# Patient Record
Sex: Male | Born: 1965 | Race: Black or African American | Hispanic: No | Marital: Single | Smoking: Current every day smoker
Health system: Southern US, Community
[De-identification: ages and names within clinical notes are randomized; demographics above are authoritative.]

## PROBLEM LIST (undated history)

## (undated) DIAGNOSIS — R011 Cardiac murmur, unspecified: Secondary | ICD-10-CM

---

## 1998-03-01 ENCOUNTER — Emergency Department (HOSPITAL_COMMUNITY): Admission: EM | Admit: 1998-03-01 | Discharge: 1998-03-01 | Payer: Self-pay | Admitting: Emergency Medicine

## 2001-10-12 ENCOUNTER — Emergency Department (HOSPITAL_COMMUNITY): Admission: EM | Admit: 2001-10-12 | Discharge: 2001-10-13 | Payer: Self-pay | Admitting: Emergency Medicine

## 2001-10-13 ENCOUNTER — Encounter: Payer: Self-pay | Admitting: Emergency Medicine

## 2002-07-15 ENCOUNTER — Emergency Department (HOSPITAL_COMMUNITY): Admission: EM | Admit: 2002-07-15 | Discharge: 2002-07-16 | Payer: Self-pay | Admitting: Emergency Medicine

## 2003-11-08 ENCOUNTER — Emergency Department (HOSPITAL_COMMUNITY): Admission: EM | Admit: 2003-11-08 | Discharge: 2003-11-08 | Payer: Self-pay

## 2009-04-06 ENCOUNTER — Emergency Department (HOSPITAL_COMMUNITY): Admission: EM | Admit: 2009-04-06 | Discharge: 2009-04-06 | Payer: Self-pay | Admitting: Emergency Medicine

## 2012-12-18 ENCOUNTER — Encounter (HOSPITAL_COMMUNITY): Payer: Self-pay | Admitting: Adult Health

## 2012-12-18 ENCOUNTER — Emergency Department (HOSPITAL_COMMUNITY)
Admission: EM | Admit: 2012-12-18 | Discharge: 2012-12-18 | Disposition: A | Discharge status: Home / Self Care | Payer: Self-pay | Attending: Emergency Medicine | Admitting: Emergency Medicine

## 2012-12-18 DIAGNOSIS — F17200 Nicotine dependence, unspecified, uncomplicated: Secondary | ICD-10-CM | POA: Insufficient documentation

## 2012-12-18 DIAGNOSIS — R011 Cardiac murmur, unspecified: Secondary | ICD-10-CM | POA: Insufficient documentation

## 2012-12-18 DIAGNOSIS — H7290 Unspecified perforation of tympanic membrane, unspecified ear: Secondary | ICD-10-CM | POA: Insufficient documentation

## 2012-12-18 DIAGNOSIS — H7293 Unspecified perforation of tympanic membrane, bilateral: Secondary | ICD-10-CM

## 2012-12-18 HISTORY — DX: Cardiac murmur, unspecified: R01.1

## 2012-12-18 MED ORDER — OFLOXACIN 0.3 % OP SOLN
5.0000 [drp] | Freq: Every day | OPHTHALMIC | Status: DC
  Administered 2012-12-18: 5 [drp] via OTIC
  Filled 2012-12-18: qty 5

## 2012-12-18 MED ORDER — OFLOXACIN 0.3 % OT SOLN
5.0000 [drp] | Freq: Every day | OTIC | Status: DC
  Filled 2012-12-18: qty 5

## 2012-12-18 NOTE — ED Provider Notes (Signed)
History     CSN: [id number]  Arrival date & time 12/18/12  0308   First MD Initiated Contact with Patient 12/18/12 [phone]      Chief Complaint  Patient presents with  . Ear Fullness    (Consider location/radiation/quality/duration/timing/severity/associated sxs/prior treatment) HPI Patient presents with concern of full sensation in both ears and hearing loss in the right greater than the left ear.  Symptoms began approximately one month ago.  Initially there was fullness and hearing loss in the right ear.  This has progressed over the interval, and the patient now complains of left ear fullness and hearing loss as well. THERE IS MILD PAIN AROUND THE POSTERIOR OF YEARS< BUT NO NEW HEADACHES< VISUAL CHANGES< FEVER< CHILLS< NAUSEA< VOMITING> THE PATIENT STATES THAT HE HAS BEEN CLEANING HIS YEARS WITH A"SCREW".   Past Medical History  Diagnosis Date  . Heart murmur     History reviewed. No pertinent past surgical history.  History reviewed. No pertinent family history.  History  Substance Use Topics  . Smoking status: Current Every Day Smoker -- 1.00 packs/day    Types: Cigarettes  . Smokeless tobacco: Not on file  . Alcohol Use: Yes      Review of Systems  All other systems reviewed and are negative.    Allergies  Review of patient's allergies indicates no known allergies.  Home Medications  No current outpatient prescriptions on file.  BP 137/87  Pulse 77  Temp(Src) 98.1 F (36.7 C) (Oral)  Resp 18  SpO2 97%  Physical Exam  Nursing note and vitals reviewed. Constitutional: He is oriented to person, place, and time. He appears well-developed and well-nourished. No distress.  HENT:  Head: Normocephalic and atraumatic.  Disruption of both tympanic membranes with visible in either ear. There is minimal surrounding erythema, no discharge in either ear  Eyes: Conjunctivae are normal. Pupils are equal, round, and reactive to light. Right eye exhibits no  discharge. Left eye exhibits no discharge.  Neck: Neck supple.  Cardiovascular: Normal rate and regular rhythm.   No murmur heard. Pulmonary/Chest: Effort normal. No stridor. No respiratory distress.  Neurological: He is alert and oriented to person, place, and time.  Poor hearing in both ears, otherwise unremarkable cranial nerve exam  Skin: Skin is warm and dry. He is not diaphoretic.  Psychiatric: He has a normal mood and affect.    ED Course  Procedures (including critical care time)  Labs Reviewed - No data to display No results found.   1. Perforation of both tympanic membranes       MDM  This patient presents with greater than one month of the year concerns, and on exam has evidence of perforation of both tympanic membranes.  Patient is afebrile, otherwise in no distress.  Given the prolonged time course of his injuries, he was started on antibiotics for both ears, discharged with ENT followup.        Gerhard Munch, MD 12/18/12 [phone]

## 2012-12-18 NOTE — ED Notes (Signed)
C/o ear pain, states its been going on for approx. 1 month. States its worse when he lays down.

## 2012-12-18 NOTE — ED Notes (Signed)
Pain to right started about one month and half again and now pain going to left ear. just coming from a party and the ear pain is getting worse, stated ear is feel full. Stated drinking 1/2 paint of liquor and six pack of beer tonight.

## 2018-04-13 ENCOUNTER — Other Ambulatory Visit: Payer: Self-pay

## 2018-04-13 ENCOUNTER — Encounter (HOSPITAL_COMMUNITY): Payer: Self-pay | Admitting: *Deleted

## 2018-04-13 ENCOUNTER — Emergency Department (HOSPITAL_COMMUNITY)
Admission: EM | Admit: 2018-04-13 | Discharge: 2018-04-14 | Disposition: A | Discharge status: Home / Self Care | Payer: Self-pay | Attending: Emergency Medicine | Admitting: Emergency Medicine

## 2018-04-13 DIAGNOSIS — M79604 Pain in right leg: Secondary | ICD-10-CM

## 2018-04-13 DIAGNOSIS — L03115 Cellulitis of right lower limb: Secondary | ICD-10-CM

## 2018-04-13 DIAGNOSIS — F17210 Nicotine dependence, cigarettes, uncomplicated: Secondary | ICD-10-CM | POA: Insufficient documentation

## 2018-04-13 NOTE — ED Triage Notes (Signed)
The pt is c/o rt lower leg pain for 2 days he initially injured his rt lower leg by a scrape he would not go into details  reness pain and swelling to the rt leg

## 2018-04-14 ENCOUNTER — Emergency Department (HOSPITAL_COMMUNITY): Payer: Self-pay

## 2018-04-14 LAB — CBC
HCT: 37.8 % — ABNORMAL LOW (ref 39.0–52.0)
HEMOGLOBIN: 12.3 g/dL — AB (ref 13.0–17.0)
MCH: 30.6 pg (ref 26.0–34.0)
MCHC: 32.5 g/dL (ref 30.0–36.0)
MCV: 94 fL (ref 80.0–100.0)
Platelets: 123 10*3/uL — ABNORMAL LOW (ref 150–400)
RBC: 4.02 MIL/uL — ABNORMAL LOW (ref 4.22–5.81)
RDW: 15.1 % (ref 11.5–15.5)
WBC: 6.2 10*3/uL (ref 4.0–10.5)
nRBC: 0 % (ref 0.0–0.2)

## 2018-04-14 LAB — BASIC METABOLIC PANEL
ANION GAP: 12 (ref 5–15)
BUN: 7 mg/dL (ref 6–20)
CO2: 27 mmol/L (ref 22–32)
CREATININE: 0.95 mg/dL (ref 0.61–1.24)
Calcium: 9.4 mg/dL (ref 8.9–10.3)
Chloride: 103 mmol/L (ref 98–111)
GFR calc non Af Amer: >60 mL/min (ref >60)
Glucose, Bld: 89 mg/dL (ref 70–99)
Potassium: 3.7 mmol/L (ref 3.5–5.1)
Sodium: 142 mmol/L (ref 135–145)

## 2018-04-14 MED ORDER — CEPHALEXIN 250 MG PO CAPS
500.0000 mg | ORAL_CAPSULE | Freq: Once | ORAL | Status: AC
Start: 2018-04-14 — End: 2018-04-14
  Administered 2018-04-14: 500 mg via ORAL
  Filled 2018-04-14: qty 2

## 2018-04-14 MED ORDER — CEPHALEXIN 500 MG PO CAPS: 500.0000 mg | ORAL_CAPSULE | Freq: Three times a day (TID) | ORAL | 0 refills | Status: AC

## 2018-04-14 NOTE — ED Notes (Signed)
Pt reports swelling, redness, and warmth to RLE for "weeks" pt reports that he recently "rubbed it against a wood pallet at work" and now the pain has been worse. Pt has abrassion to right leg that has a scab at this time.

## 2018-04-14 NOTE — ED Notes (Signed)
Patient transported to X-ray 

## 2018-04-14 NOTE — ED Provider Notes (Signed)
[facility] EMERGENCY DEPARTMENT Provider Note  CSN: [id number] Arrival date & time: 04/13/18 2318  Chief Complaint(s) Leg Pain  HPI Steven Arellano is a 52 y.o. male who presents to the emergency department with right leg pain that began 2 days ago.  Patient reports that he had his shin on a wood pallet while at work.  Since then he is noted increased pain and swelling.  He does endorse bilateral lower extremity swelling which is been there for several weeks which she attributes to being on his feet all day.  However the right side swelling has worsened since the injury.  He endorses associated pain with palpation and mild redness.  Pain is exacerbated with palpation.  Alleviated by mobility.  Denies any other injuries.  Denies any associated chest pain or shortness of breath.  No prior history of blood clots.  He does endorse illicit drug use including crack and cocaine.  Reports daily alcohol and tobacco use.  HPI  Past Medical History Past Medical History:  Diagnosis Date  . Heart murmur    There are no active problems to display for this patient.  Home Medication(s) Prior to Admission medications   Medication Sig Start Date End Date Taking? Authorizing Provider  cephALEXin (KEFLEX) 500 MG capsule Take 1 capsule (500 mg total) by mouth 3 (three) times daily for 7 days. 04/14/18 04/21/18  Nira Conn, MD                                                                                                                                    Past Surgical History History reviewed. No pertinent surgical history. Family History No family history on file.  Social History Social History   Tobacco Use  . Smoking status: Current Every Day Smoker    Packs/day: 1.00    Types: Cigarettes  Substance Use Topics  . Alcohol use: Yes  . Drug use: Yes    Types: Cocaine   Allergies Patient has no known allergies.  Review of Systems Review of Systems All other  systems are reviewed and are negative for acute change except as noted in the HPI  Physical Exam Vital Signs  I have reviewed the triage vital signs BP (!) 137/98 (BP Location: Right Arm)   Pulse 67   Temp 98.9 F (37.2 C) (Oral)   Resp 16   Ht 5\' 11"  (1.803 m)   Wt 81.6 kg   SpO2 99%   BMI 25.10 kg/m   Physical Exam  Constitutional: He is oriented to person, place, and time. He appears well-developed and well-nourished. No distress.  HENT:  Head: Normocephalic and atraumatic.  Right Ear: External ear normal.  Left Ear: External ear normal.  Nose: Nose normal.  Mouth/Throat: Mucous membranes are normal. No trismus in the jaw.  Eyes: Pupils are equal, round, and reactive to light. Conjunctivae and EOM are normal. Right eye exhibits no  discharge. Left eye exhibits no discharge. No scleral icterus.  Neck: Normal range of motion and phonation normal. Neck supple.  Cardiovascular: Normal rate and regular rhythm. Exam reveals no gallop and no friction rub.  No murmur heard. Pulmonary/Chest: Effort normal and breath sounds normal. No stridor. No respiratory distress. He has no rales.  Abdominal: Soft. He exhibits no distension. There is no tenderness.  Musculoskeletal: Normal range of motion.       Right lower leg: He exhibits tenderness, swelling and edema.       Legs: Neurological: He is alert and oriented to person, place, and time.  Skin: Skin is warm and dry. No rash noted. He is not diaphoretic. No erythema.  Psychiatric: He has a normal mood and affect. His behavior is normal.  Vitals reviewed.   ED Results and Treatments Labs (all labs ordered are listed, but only abnormal results are displayed) Labs Reviewed  CBC - Abnormal; Notable for the following components:      Result Value   RBC 4.02 (*)    Hemoglobin 12.3 (*)    HCT 37.8 (*)    Platelets 123 (*)    All other components within normal limits  BASIC METABOLIC PANEL                                                                                                                          EKG  EKG Interpretation  Date/Time:    Ventricular Rate:    PR Interval:    QRS Duration:   QT Interval:    QTC Calculation:   R Axis:     Text Interpretation:        Radiology Dg Tibia/fibula Right  Result Date: 04/14/2018 CLINICAL DATA:  52 y/o  M; right tibia fibula injury 2 days ago. EXAM: RIGHT TIBIA AND FIBULA - 2 VIEW COMPARISON:  None. FINDINGS: There is no evidence of fracture or other focal bone lesions. Soft tissues are unremarkable. IMPRESSION: Negative. Electronically Signed   By: Mitzi Hansen M.D.   On: 04/14/2018 02:37   Pertinent labs & imaging results that were available during my care of the patient were reviewed by me and considered in my medical decision making (see chart for details).  Medications Ordered in ED Medications  cephALEXin (KEFLEX) capsule 500 mg (500 mg Oral Given 04/14/18 0302)  Procedures Procedures  (including critical care time)  Medical Decision Making / ED Course I have reviewed the nursing notes for this encounter and the patient's prior records (if available in EHR or on provided paperwork).    Right leg pain following mild trauma.  There is associated hyperemia and tenderness to palpation in the area.  Plain film was negative for any acute injuries or subcutaneous air.  Labs grossly reassuring without leukocytosis.  Possible soft tissue contusion however given the mild hyperemia, will treat for possible cellulitis.  Doubt DVT.   Final Clinical Impression(s) / ED Diagnoses Final diagnoses:  Right leg pain  Cellulitis of right lower extremity    Disposition: Discharge  Condition: Good  I have discussed the results, Dx and Tx plan with the patient who expressed understanding and agree(s) with the plan. Discharge  instructions discussed at great length. The patient was given strict return precautions who verbalized understanding of the instructions. No further questions at time of discharge.    ED Discharge Orders         Ordered    cephALEXin (KEFLEX) 500 MG capsule  3 times daily     04/14/18 0326           Follow Up: Primary care provider   If you do not have a primary care physician, contact [facility] at [phone] for referral     This chart was dictated using voice recognition software.  Despite best efforts to proofread,  errors can occur which can change the documentation meaning.   Nira Conn, MD 04/14/18 [phone]

## 2018-04-14 NOTE — ED Notes (Signed)
ED Provider at bedside. 

## 2018-09-14 ENCOUNTER — Other Ambulatory Visit: Payer: Self-pay

## 2018-09-14 ENCOUNTER — Emergency Department (HOSPITAL_COMMUNITY)
Admission: EM | Admit: 2018-09-14 | Discharge: 2018-09-14 | Disposition: A | Discharge status: Home / Self Care | Payer: Self-pay | Attending: Emergency Medicine | Admitting: Emergency Medicine

## 2018-09-14 ENCOUNTER — Emergency Department (HOSPITAL_COMMUNITY): Payer: Self-pay

## 2018-09-14 ENCOUNTER — Encounter (HOSPITAL_COMMUNITY): Payer: Self-pay | Admitting: *Deleted

## 2018-09-14 DIAGNOSIS — Y9389 Activity, other specified: Secondary | ICD-10-CM | POA: Insufficient documentation

## 2018-09-14 DIAGNOSIS — F17210 Nicotine dependence, cigarettes, uncomplicated: Secondary | ICD-10-CM | POA: Insufficient documentation

## 2018-09-14 DIAGNOSIS — S0102XA Laceration with foreign body of scalp, initial encounter: Secondary | ICD-10-CM | POA: Insufficient documentation

## 2018-09-14 DIAGNOSIS — R030 Elevated blood-pressure reading, without diagnosis of hypertension: Secondary | ICD-10-CM

## 2018-09-14 DIAGNOSIS — S0181XA Laceration without foreign body of other part of head, initial encounter: Secondary | ICD-10-CM

## 2018-09-14 DIAGNOSIS — S0182XA Laceration with foreign body of other part of head, initial encounter: Secondary | ICD-10-CM | POA: Insufficient documentation

## 2018-09-14 DIAGNOSIS — S0101XA Laceration without foreign body of scalp, initial encounter: Secondary | ICD-10-CM

## 2018-09-14 DIAGNOSIS — Y999 Unspecified external cause status: Secondary | ICD-10-CM | POA: Insufficient documentation

## 2018-09-14 DIAGNOSIS — Y92410 Unspecified street and highway as the place of occurrence of the external cause: Secondary | ICD-10-CM | POA: Insufficient documentation

## 2018-09-14 MED ORDER — LIDOCAINE-EPINEPHRINE-TETRACAINE (LET) SOLUTION
3.0000 mL | Freq: Once | NASAL | Status: AC
  Administered 2018-09-14: 3 mL via TOPICAL
  Filled 2018-09-14: qty 3

## 2018-09-14 MED ORDER — LIDOCAINE-EPINEPHRINE (PF) 2 %-1:200000 IJ SOLN: 10.0000 mL | Freq: Once | INTRAMUSCULAR | Status: DC

## 2018-09-14 NOTE — ED Notes (Signed)
Patient verbalizes understanding of discharge instructions . Opportunity for questions and answers were provided . Armband removed by staff ,Pt discharged from ED. W/C  offered at D/C  and Declined W/C at D/C and was escorted to lobby by RN.  
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
## 2018-09-20 ENCOUNTER — Other Ambulatory Visit: Payer: Self-pay

## 2018-09-20 ENCOUNTER — Encounter (HOSPITAL_COMMUNITY): Payer: Self-pay | Admitting: Emergency Medicine

## 2018-09-20 ENCOUNTER — Emergency Department (HOSPITAL_COMMUNITY)
Admission: EM | Admit: 2018-09-20 | Discharge: 2018-09-20 | Disposition: A | Discharge status: Home / Self Care | Payer: No Typology Code available for payment source | Attending: Emergency Medicine | Admitting: Emergency Medicine

## 2018-09-20 ENCOUNTER — Emergency Department (HOSPITAL_COMMUNITY): Payer: No Typology Code available for payment source

## 2018-09-20 DIAGNOSIS — Y929 Unspecified place or not applicable: Secondary | ICD-10-CM | POA: Insufficient documentation

## 2018-09-20 DIAGNOSIS — F17210 Nicotine dependence, cigarettes, uncomplicated: Secondary | ICD-10-CM | POA: Diagnosis not present

## 2018-09-20 DIAGNOSIS — S161XXA Strain of muscle, fascia and tendon at neck level, initial encounter: Secondary | ICD-10-CM | POA: Diagnosis present

## 2018-09-20 DIAGNOSIS — Y999 Unspecified external cause status: Secondary | ICD-10-CM | POA: Diagnosis not present

## 2018-09-20 DIAGNOSIS — Y9389 Activity, other specified: Secondary | ICD-10-CM | POA: Diagnosis not present

## 2018-09-20 NOTE — ED Notes (Signed)
Patient verbalizes understanding of discharge instructions . Opportunity for questions and answers were provided . Armband removed by staff ,Pt discharged from ED. W/C  offered at D/C  and Declined W/C at D/C and was escorted to lobby by RN.  
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
## 2018-09-20 NOTE — Discharge Instructions (Signed)
Please read attached information. If you experience any new or worsening signs or symptoms please return to the emergency room for evaluation. Please follow-up with your primary care provider or specialist as discussed.  °
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
## 2020-04-27 ENCOUNTER — Encounter (HOSPITAL_COMMUNITY): Payer: Self-pay

## 2020-04-27 ENCOUNTER — Other Ambulatory Visit: Payer: Self-pay

## 2020-04-27 ENCOUNTER — Emergency Department (HOSPITAL_COMMUNITY)
Admission: EM | Admit: 2020-04-27 | Discharge: 2020-04-27 | Disposition: A | Discharge status: Home / Self Care | Payer: Self-pay | Attending: Emergency Medicine | Admitting: Emergency Medicine

## 2020-04-27 DIAGNOSIS — H5789 Other specified disorders of eye and adnexa: Secondary | ICD-10-CM | POA: Insufficient documentation

## 2020-04-27 DIAGNOSIS — J069 Acute upper respiratory infection, unspecified: Secondary | ICD-10-CM | POA: Insufficient documentation

## 2020-04-27 DIAGNOSIS — Z20822 Contact with and (suspected) exposure to covid-19: Secondary | ICD-10-CM | POA: Insufficient documentation

## 2020-04-27 DIAGNOSIS — F17210 Nicotine dependence, cigarettes, uncomplicated: Secondary | ICD-10-CM | POA: Insufficient documentation

## 2020-04-27 LAB — RESPIRATORY PANEL BY RT PCR (FLU A&B, COVID)
Influenza A by PCR: NEGATIVE
Influenza B by PCR: NEGATIVE
SARS Coronavirus 2 by RT PCR: NEGATIVE

## 2020-04-27 MED ORDER — ACETAMINOPHEN 325 MG PO TABS
650.0000 mg | ORAL_TABLET | Freq: Once | ORAL | Status: AC
  Administered 2020-04-27: 650 mg via ORAL
  Filled 2020-04-27: qty 2

## 2020-04-27 MED ORDER — IBUPROFEN 400 MG PO TABS
600.0000 mg | ORAL_TABLET | Freq: Once | ORAL | Status: AC
  Administered 2020-04-27: 600 mg via ORAL
  Filled 2020-04-27: qty 1

## 2020-04-27 NOTE — ED Provider Notes (Signed)
[facility] EMERGENCY DEPARTMENT Provider Note   CSN: [id number] Arrival date & time: 04/27/20  1546     History Chief Complaint  Patient presents with   Burning Eyes   Generalized Body Aches   Nasal Congestion    RYDEN WAINER is a 54 y.o. male.  The history is provided by the patient.  URI Presenting symptoms: congestion, cough, fatigue, rhinorrhea and sore throat   Presenting symptoms: no ear pain and no fever   Severity:  Moderate Onset quality:  Sudden Duration:  1 day Timing:  Constant Progression:  Worsening Chronicity:  New Relieved by:  Nothing Worsened by:  Nothing Associated symptoms: myalgias and sneezing   Associated symptoms: no arthralgias   Risk factors: sick contacts (Reports his brother's kids are sick with similar symptoms)        Past Medical History:  Diagnosis Date   Heart murmur     There are no problems to display for this patient.   History reviewed. No pertinent surgical history.     History reviewed. No pertinent family history.  Social History   Tobacco Use   Smoking status: Current Every Day Smoker    Packs/day: 1.00    Types: Cigarettes   Smokeless tobacco: Never Used  Vaping Use   Vaping Use: Never used  Substance Use Topics   Alcohol use: Yes   Drug use: Yes    Types: Cocaine    Home Medications Prior to Admission medications   Not on File    Allergies    Patient has no known allergies.  Review of Systems   Review of Systems  Constitutional: Positive for fatigue. Negative for chills and fever.  HENT: Positive for congestion, rhinorrhea, sneezing and sore throat. Negative for ear pain.   Eyes: Positive for pain, discharge and redness. Negative for visual disturbance.  Respiratory: Positive for cough. Negative for shortness of breath.   Cardiovascular: Negative for chest pain and palpitations.  Gastrointestinal: Negative for abdominal pain and vomiting.  Genitourinary:  Negative for dysuria and hematuria.  Musculoskeletal: Positive for myalgias. Negative for arthralgias and back pain.  Skin: Negative for color change and rash.  Neurological: Negative for seizures and syncope.  All other systems reviewed and are negative.   Physical Exam Updated Vital Signs BP (!) 142/71    Pulse 79    Temp 98.1 F (36.7 C) (Oral)    Resp 18    Ht 5\' 11"  (1.803 m)    Wt 59.9 kg    SpO2 100%    BMI 18.41 kg/m   Physical Exam Vitals and nursing note reviewed.  Constitutional:      Appearance: He is well-developed. He is not ill-appearing, toxic-appearing or diaphoretic.     Comments: Appears uncomfortable  HENT:     Head: Normocephalic and atraumatic.     Right Ear: External ear normal.     Left Ear: External ear normal.     Nose: Rhinorrhea present.     Mouth/Throat:     Mouth: Mucous membranes are moist.     Pharynx: Oropharynx is clear. No oropharyngeal exudate or posterior oropharyngeal erythema.  Eyes:     General:        Right eye: Discharge (copious watery) present.        Left eye: Discharge (copious watery) present.    Conjunctiva/sclera:     Right eye: Right conjunctiva is injected.     Left eye: Left conjunctiva is injected.  Pupils: Pupils are equal, round, and reactive to light.     Visual Fields: Right eye visual fields normal and left eye visual fields normal.  Cardiovascular:     Rate and Rhythm: Normal rate and regular rhythm.     Heart sounds: No murmur heard.  No gallop.   Pulmonary:     Effort: Pulmonary effort is normal. No respiratory distress.     Breath sounds: Normal breath sounds.  Abdominal:     Palpations: Abdomen is soft.     Tenderness: There is no abdominal tenderness.  Musculoskeletal:     Cervical back: Neck supple.  Lymphadenopathy:     Cervical: No cervical adenopathy.  Skin:    General: Skin is warm and dry.  Neurological:     Mental Status: He is alert.     ED Results / Procedures / Treatments   Labs (all  labs ordered are listed, but only abnormal results are displayed) Labs Reviewed  RESPIRATORY PANEL BY RT PCR (FLU A&B, COVID)    EKG None  Radiology No results found.  Procedures Procedures (including critical care time)  Medications Ordered in ED Medications - No data to display  ED Course  I have reviewed the triage vital signs and the nursing notes.  Pertinent labs & imaging results that were available during my care of the patient were reviewed by me and considered in my medical decision making (see chart for details).    MDM Rules/Calculators/A&P                          The patient is a 54yo male, PMH otherwise healthy who presents to the ED for URI symptoms.  On my initial evaluation, the patient is hemodynamically stable, afebrile, nontoxic-appearing. Physical exam remarkable for inflamed conjunctive a but no purulent discharge, some rhinorrhea, no visual deficits.  Likely viral illness with watery discharge and multiple symptoms.  Covid test ordered and pending.  Tylenol and Motrin provided for pain and discomfort.  Very low suspicion for pneumonia or bacterial infection, do not think patient requires chest x-ray, labs, or antibiotics or other interventions at this time.  Advised patient of concern for viral illness. Advised treatment of symptoms with Tylenol Motrin every 6 hours for pain, Benadryl or other antihistamine for conjunctival symptoms.  Advised isolation precautions until Covid test back and negative, or for 10 days if positive.  Recommended follow-up with PCP in the next couple days. Strict return precautions provided. Patient discharged in stable condition.   The care of this patient was overseen by Dr. Judd Lien, who agreed with evaluation and plan of care.   Final Clinical Impression(s) / ED Diagnoses Final diagnoses:  Upper respiratory tract infection, unspecified type    Rx / DC Orders ED Discharge Orders    None       Loletha Carrow,  MD 04/27/20 Windy Carina    Geoffery Lyons, MD 04/27/20 2225

## 2020-04-27 NOTE — ED Triage Notes (Signed)
Pt presents to ED onset yesterday sore throat, nasal congestion, body aches cough, bilateral eye burning and sclera redness.  Pt states he normally everyday has eye drainage and watery eyes.

## 2021-03-11 ENCOUNTER — Emergency Department (HOSPITAL_COMMUNITY): Payer: Self-pay

## 2021-03-11 ENCOUNTER — Other Ambulatory Visit: Payer: Self-pay

## 2021-03-11 ENCOUNTER — Emergency Department (HOSPITAL_COMMUNITY)
Admission: EM | Admit: 2021-03-11 | Discharge: 2021-03-11 | Disposition: A | Discharge status: Home / Self Care | Payer: Self-pay | Attending: Emergency Medicine | Admitting: Emergency Medicine

## 2021-03-11 DIAGNOSIS — F17210 Nicotine dependence, cigarettes, uncomplicated: Secondary | ICD-10-CM | POA: Insufficient documentation

## 2021-03-11 DIAGNOSIS — M545 Low back pain, unspecified: Secondary | ICD-10-CM | POA: Insufficient documentation

## 2021-03-11 DIAGNOSIS — R109 Unspecified abdominal pain: Secondary | ICD-10-CM | POA: Insufficient documentation

## 2021-03-11 LAB — CBC WITH DIFFERENTIAL/PLATELET
Abs Immature Granulocytes: 0.01 10*3/uL (ref 0.00–0.07)
Basophils Absolute: 0 10*3/uL (ref 0.0–0.1)
Basophils Relative: 1 %
Eosinophils Absolute: 0.1 10*3/uL (ref 0.0–0.5)
Eosinophils Relative: 3 %
HCT: 41.8 % (ref 39.0–52.0)
Hemoglobin: 13.6 g/dL (ref 13.0–17.0)
Immature Granulocytes: 0 %
Lymphocytes Relative: 33 %
Lymphs Abs: 1.3 10*3/uL (ref 0.7–4.0)
MCH: 31 pg (ref 26.0–34.0)
MCHC: 32.5 g/dL (ref 30.0–36.0)
MCV: 95.2 fL (ref 80.0–100.0)
Monocytes Absolute: 0.8 10*3/uL (ref 0.1–1.0)
Monocytes Relative: 19 %
Neutro Abs: 1.8 10*3/uL (ref 1.7–7.7)
Neutrophils Relative %: 44 %
Platelets: 163 10*3/uL (ref 150–400)
RBC: 4.39 MIL/uL (ref 4.22–5.81)
RDW: 14.8 % (ref 11.5–15.5)
WBC: 4 10*3/uL (ref 4.0–10.5)
nRBC: 0 % (ref 0.0–0.2)

## 2021-03-11 LAB — BASIC METABOLIC PANEL
Anion gap: 12 (ref 5–15)
BUN: 8 mg/dL (ref 6–20)
CO2: 28 mmol/L (ref 22–32)
Calcium: 9.2 mg/dL (ref 8.9–10.3)
Chloride: 99 mmol/L (ref 98–111)
Creatinine, Ser: 1.05 mg/dL (ref 0.61–1.24)
GFR, Estimated: >60 mL/min (ref >60)
Glucose, Bld: 104 mg/dL — ABNORMAL HIGH (ref 70–99)
Potassium: 4.1 mmol/L (ref 3.5–5.1)
Sodium: 139 mmol/L (ref 135–145)

## 2021-03-11 LAB — URINALYSIS, ROUTINE W REFLEX MICROSCOPIC
Bilirubin Urine: NEGATIVE
Glucose, UA: NEGATIVE mg/dL
Hgb urine dipstick: NEGATIVE
Ketones, ur: NEGATIVE mg/dL
Leukocytes,Ua: NEGATIVE
Nitrite: NEGATIVE
Protein, ur: NEGATIVE mg/dL
Specific Gravity, Urine: 1.017 (ref 1.005–1.030)
pH: 8 (ref 5.0–8.0)

## 2021-03-11 MED ORDER — METHOCARBAMOL 500 MG PO TABS: 500.0000 mg | ORAL_TABLET | Freq: Four times a day (QID) | ORAL | 0 refills | Status: AC

## 2021-03-11 MED ORDER — DICLOFENAC SODIUM 75 MG PO TBEC: 75.0000 mg | DELAYED_RELEASE_TABLET | Freq: Two times a day (BID) | ORAL | 0 refills | Status: AC

## 2021-03-11 MED ORDER — DICLOFENAC SODIUM 75 MG PO TBEC
75.0000 mg | DELAYED_RELEASE_TABLET | Freq: Once | ORAL | Status: AC
  Administered 2021-03-11: 75 mg via ORAL
  Filled 2021-03-11: qty 1

## 2021-03-11 NOTE — ED Notes (Signed)
All care rendered by provider. 

## 2021-03-11 NOTE — ED Provider Notes (Signed)
[facility] EMERGENCY DEPARTMENT Provider Note   CSN: [id number] Arrival date & time: 03/11/21  1245     History Chief Complaint  Patient presents with   Flank Pain    Steven Arellano is a 55 y.o. male.  Pt complains of pain in both sides of his low back   The history is provided by the patient. No language interpreter was used.  Flank Pain This is a new problem. The current episode started 12 to 24 hours ago. The problem occurs constantly. The problem has not changed since onset.Pertinent negatives include no chest pain and no abdominal pain. Nothing aggravates the symptoms. Nothing relieves the symptoms. He has tried nothing for the symptoms. The treatment provided moderate relief.      Past Medical History:  Diagnosis Date   Heart murmur     There are no problems to display for this patient.   No past surgical history on file.     No family history on file.  Social History   Tobacco Use   Smoking status: Every Day    Packs/day: 1.00    Types: Cigarettes   Smokeless tobacco: Never  Vaping Use   Vaping Use: Never used  Substance Use Topics   Alcohol use: Yes   Drug use: Yes    Types: Cocaine    Home Medications Prior to Admission medications   Not on File    Allergies    Patient has no known allergies.  Review of Systems   Review of Systems  Cardiovascular:  Negative for chest pain.  Gastrointestinal:  Negative for abdominal pain.  Genitourinary:  Positive for flank pain.  All other systems reviewed and are negative.  Physical Exam Updated Vital Signs BP (!) 201/111   Pulse 64   Temp 99.4 F (37.4 C) (Oral)   Resp 18   SpO2 100%   Physical Exam Vitals and nursing note reviewed.  Constitutional:      Appearance: He is well-developed.  HENT:     Head: Normocephalic and atraumatic.  Eyes:     Conjunctiva/sclera: Conjunctivae normal.  Cardiovascular:     Rate and Rhythm: Normal rate and regular rhythm.     Heart  sounds: No murmur heard. Pulmonary:     Effort: Pulmonary effort is normal. No respiratory distress.     Breath sounds: Normal breath sounds.  Abdominal:     Palpations: Abdomen is soft.     Tenderness: There is no abdominal tenderness.  Musculoskeletal:     Cervical back: Neck supple.     Comments: Pain with movement, pain with deep breath  Skin:    General: Skin is warm and dry.  Neurological:     Mental Status: He is alert.  Psychiatric:        Mood and Affect: Mood normal.    ED Results / Procedures / Treatments   Labs (all labs ordered are listed, but only abnormal results are displayed) Labs Reviewed  BASIC METABOLIC PANEL - Abnormal; Notable for the following components:      Result Value   Glucose, Bld 104 (*)    All other components within normal limits  URINE CULTURE  CBC WITH DIFFERENTIAL/PLATELET  URINALYSIS, ROUTINE W REFLEX MICROSCOPIC    EKG None  Radiology CT Renal Stone Study  Result Date: 03/11/2021 CLINICAL DATA:  Flank pain and kidney stone. EXAM: CT ABDOMEN AND PELVIS WITHOUT CONTRAST TECHNIQUE: Multidetector CT imaging of the abdomen and pelvis was performed following the standard  protocol without IV contrast. COMPARISON:  None. FINDINGS: Lower chest: No acute abnormality. Hepatobiliary: No focal liver abnormality is seen. No gallstones, gallbladder wall thickening, or biliary dilatation. Pancreas: Unremarkable. No pancreatic ductal dilatation or surrounding inflammatory changes. Spleen: Normal in size without focal abnormality. Adrenals/Urinary Tract: Adrenal glands are unremarkable. Kidneys are normal, without renal calculi, focal lesion, or hydronephrosis. Bladder is unremarkable. Stomach/Bowel: Stomach is within normal limits. Appendix is not visualized. No evidence of bowel wall thickening, distention, or inflammatory changes. Vascular/Lymphatic: Aortic atherosclerosis. No enlarged abdominal or pelvic lymph nodes. Reproductive: Prostate is unremarkable.  Other: No abdominal wall hernia or abnormality. No abdominopelvic ascites. Musculoskeletal: No acute or significant osseous findings. IMPRESSION: 1. No evidence for urinary tract calculus or hydronephrosis. 2.  Aortic Atherosclerosis (ICD10-I70.0). Electronically Signed   By: Darliss Cheney M.D.   On: 03/11/2021 15:26    Procedures Procedures   Medications Ordered in ED Medications - No data to display  ED Course  I have reviewed the triage vital signs and the nursing notes.  Pertinent labs & imaging results that were available during my care of the patient were reviewed by me and considered in my medical decision making (see chart for details).    MDM Rules/Calculators/A&P                           MDM:  ua negative, ct renal  no acute abnormality   Final Clinical Impression(s) / ED Diagnoses Final diagnoses:  Acute low back pain without sciatica, unspecified back pain laterality  An After Visit Summary was printed and given to the patient.  Rx / DC Orders ED Discharge Orders     None        Osie Cheeks 03/11/21 2218    Pollyann Savoy, MD 03/11/21 [phone]

## 2021-03-11 NOTE — ED Provider Notes (Signed)
Emergency Medicine Provider Triage Evaluation Note  Steven Arellano , a 55 y.o. male  was evaluated in triage.  Pt complains of bilateral flank pain.  Pain is greater on left flank.  Pain has been present since Monday evening.  Denies any recent falls, turning injury, or heavy lifting.  Patient denies any dysuria, hematuria, urinary urgency, urinary frequency, fevers, chills.  Review of Systems  Positive: Flank pain Negative: dysuria, hematuria, urinary urgency, urinary frequency, fevers, chills  Physical Exam  BP (!) 150/93   Pulse 83   Temp 99 F (37.2 C) (Oral)   Resp 18   SpO2 98%  Gen:   Awake, no distress   Resp:  Normal effort  MSK:   Moves extremities without difficulty  Other:  Abdomen soft, nondistended, nontender.  Positive left CVA tenderness.  Medical Decision Making  Medically screening exam initiated at 2:11 PM.  Appropriate orders placed.  HAREL REPETTO was informed that the remainder of the evaluation will be completed by another provider, this initial triage assessment does not replace that evaluation, and the importance of remaining in the ED until their evaluation is complete.  The patient appears stable so that the remainder of the work up may be completed by another provider.      Haskel Schroeder, PA-C 03/11/21 1412    Blane Ohara, MD 03/12/21 [phone]

## 2021-03-11 NOTE — ED Triage Notes (Signed)
Pt with bilateral flank pain x 2 days. Denies injury, burning with urination or blood in urine.

## 2021-03-12 LAB — URINE CULTURE: Culture: NO GROWTH

## 2021-04-11 ENCOUNTER — Emergency Department (HOSPITAL_COMMUNITY)
Admission: EM | Admit: 2021-04-11 | Discharge: 2021-04-12 | Disposition: A | Discharge status: Home / Self Care | Payer: Self-pay | Attending: Emergency Medicine | Admitting: Emergency Medicine

## 2021-04-11 DIAGNOSIS — M5440 Lumbago with sciatica, unspecified side: Secondary | ICD-10-CM | POA: Insufficient documentation

## 2021-04-11 DIAGNOSIS — F17210 Nicotine dependence, cigarettes, uncomplicated: Secondary | ICD-10-CM | POA: Insufficient documentation

## 2021-04-11 NOTE — ED Provider Notes (Signed)
Emergency Medicine Provider Triage Evaluation Note  Steven Arellano , a 55 y.o. male  was evaluated in triage.  Pt complains of low back pain.  Seen for same 3 weeks ago and had CT without acute findings.  States pain has worsened and he is missing more work.  Has a physical job at [employer].  States his job told him he needs a "second opinion".  Denies numbness, weakness, bowel or bladder incontinence.  Review of Systems  Positive: Back pain Negative: Numbness, weakness  Physical Exam  BP (!) 167/95 (BP Location: Right Arm)   Pulse 92   Temp 98.3 F (36.8 C) (Oral)   Resp 16   SpO2 100%  Gen:   Awake, no distress   Resp:  Normal effort  MSK:   Moves extremities without difficulty  Other:    Medical Decision Making  Medically screening exam initiated at 11:55 PM.  Appropriate orders placed.  KEGAN MCKEITHAN was informed that the remainder of the evaluation will be completed by another provider, this initial triage assessment does not replace that evaluation, and the importance of remaining in the ED until their evaluation is complete.  Back pain.  No focal neurologic deficits.   Garlon Hatchet, PA-C 04/12/21 0003    Nira Conn, MD 04/12/21 [phone]

## 2021-04-12 ENCOUNTER — Encounter (HOSPITAL_COMMUNITY): Payer: Self-pay | Admitting: *Deleted

## 2021-04-12 MED ORDER — PREDNISONE 20 MG PO TABS: 40.0000 mg | ORAL_TABLET | Freq: Every day | ORAL | 0 refills | Status: AC
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
## 2021-04-24 NOTE — ED Provider Notes (Signed)
[facility] EMERGENCY DEPARTMENT Provider Note   CSN: [id number] Arrival date & time: 04/11/21  2349     History Chief Complaint  Patient presents with   Back Pain    Steven Arellano is a 55 y.o. male.   Back Pain Associated symptoms: no abdominal pain, no chest pain, no numbness and no weakness   Patient presents after back pain.  Had some pain around 3 weeks ago.  Had some treatment at that time and had a CT scan that was reassuring.  States pain is gotten worse.  Had been improving.  No numbness weakness.  No confusion.  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  No new injury.  No numbness or weakness.  No fevers.  No history of IV drug use.  No malignancy history.    Past Medical History:  Diagnosis Date   Heart murmur     There are no problems to display for this patient.   History reviewed. No pertinent surgical history.     No family history on file.  Social History   Tobacco Use   Smoking status: Every Day    Packs/day: 1.00    Types: Cigarettes   Smokeless tobacco: Never  Vaping Use   Vaping Use: Never used  Substance Use Topics   Alcohol use: Yes    Comment: 1 12 pack of beer and 1 pint of liquor per day   Drug use: Yes    Types: Cocaine    Home Medications Prior to Admission medications   Medication Sig Start Date End Date Taking? Authorizing Provider  predniSONE (DELTASONE) 20 MG tablet Take 2 tablets (40 mg total) by mouth daily. 04/12/21  Yes Benjiman Core, MD  diclofenac (VOLTAREN) 75 MG EC tablet Take 1 tablet (75 mg total) by mouth 2 (two) times daily. 03/11/21   Elson Areas, PA-C  methocarbamol (ROBAXIN) 500 MG tablet Take 1 tablet (500 mg total) by mouth 4 (four) times daily. 03/11/21   Elson Areas, PA-C    Allergies    Patient has no known allergies.  Review of Systems   Review of Systems  Constitutional:  Negative for appetite change.  Respiratory:  Negative for  shortness of breath.   Cardiovascular:  Negative for chest pain.  Gastrointestinal:  Negative for abdominal pain.  Genitourinary:  Negative for frequency.  Musculoskeletal:  Positive for back pain.  Skin:  Negative for rash.  Neurological:  Negative for weakness and numbness.   Physical Exam Updated Vital Signs BP (!) 169/89 (BP Location: Right Arm)   Pulse 82   Temp 98.4 F (36.9 C) (Oral)   Resp 20   SpO2 100%   Physical Exam Vitals and nursing note reviewed.  HENT:     Head: Normocephalic.  Eyes:     Pupils: Pupils are equal, round, and reactive to light.  Cardiovascular:     Rate and Rhythm: Regular rhythm.  Abdominal:     Tenderness: There is no abdominal tenderness.  Musculoskeletal:     Cervical back: Neck supple.     Comments: Mild lumbar tenderness.  Skin:    General: Skin is warm.     Capillary Refill: Capillary refill takes less than 2 seconds.  Neurological:     Mental Status: He is alert and oriented to person, place, and time.    ED Results / Procedures / Treatments   Labs (all labs ordered are listed, but only abnormal results  are displayed) Labs Reviewed - No data to display  EKG None  Radiology No results found.  Procedures Procedures   Medications Ordered in ED Medications - No data to display  ED Course  I have reviewed the triage vital signs and the nursing notes.  Pertinent labs & imaging results that were available during my care of the patient were reviewed by me and considered in my medical decision making (see chart for details).    MDM Rules/Calculators/A&P                           Patient with low back pain.  Worsened again recently but had some previously.  Had been treated symptomatically but no steroids that time.  No real red flags.  Will treat with steroids and see if it helps.  Follow-up as an outpatient as needed Final Clinical Impression(s) / ED Diagnoses Final diagnoses:  Acute midline low back pain with sciatica,  sciatica laterality unspecified    Rx / DC Orders ED Discharge Orders          Ordered    predniSONE (DELTASONE) 20 MG tablet  Daily        04/12/21 1206             Benjiman Core, MD 04/24/21 2348

## 2021-06-02 NOTE — Progress Notes (Signed)
Erroneous encounter

## 2021-06-04 ENCOUNTER — Encounter: Payer: Self-pay | Admitting: Family

## 2021-06-04 DIAGNOSIS — Z09 Encounter for follow-up examination after completed treatment for conditions other than malignant neoplasm: Secondary | ICD-10-CM

## 2021-06-04 DIAGNOSIS — M5440 Lumbago with sciatica, unspecified side: Secondary | ICD-10-CM

## 2021-06-04 DIAGNOSIS — Z7689 Persons encountering health services in other specified circumstances: Secondary | ICD-10-CM

## 2021-12-28 IMAGING — CT CT RENAL STONE PROTOCOL
2 of 4 series · 16 of 46 positions shown, 18 images · non-contrast
Comparison: None.

CLINICAL DATA: Flank pain and kidney stone.

EXAM:
CT ABDOMEN AND PELVIS WITHOUT CONTRAST
TECHNIQUE: Multidetector CT imaging of the abdomen and pelvis was performed
following the standard protocol without IV contrast.

[Series 6: cor · coronal · 0.57mm/px · 3 of 112 slices shown]
[im 38/112  soft-tissue]
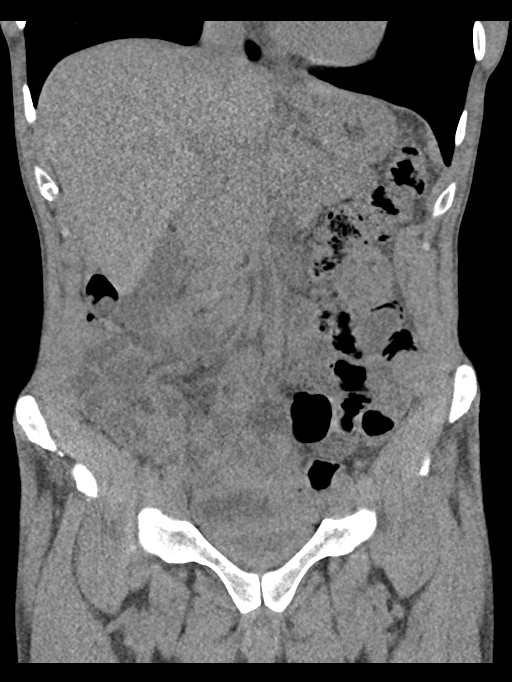
[im 50/112  soft-tissue]
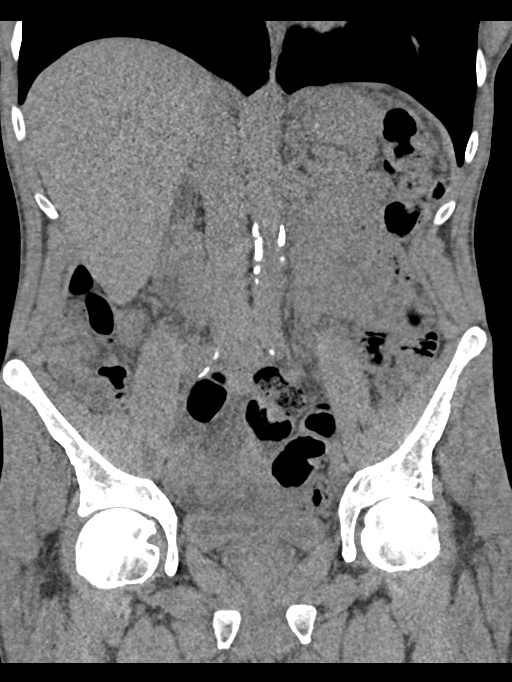
[im 62/112  soft-tissue]
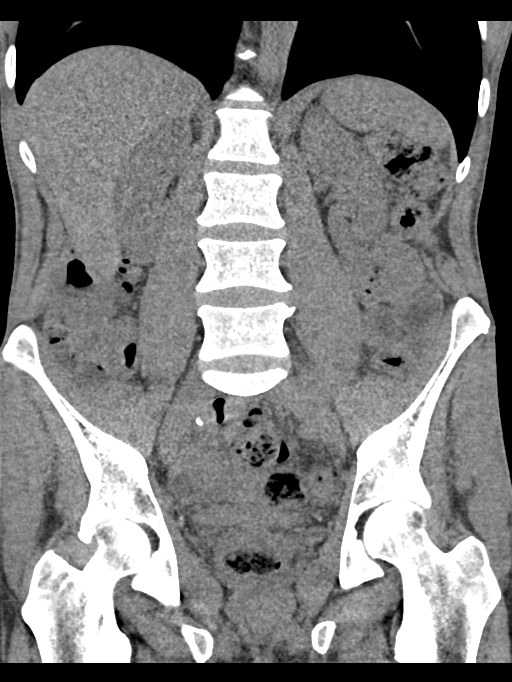

[Series 7: renal stone 5.0 · axial · 0.62mm/px · z∈[-408,-68]mm · 13 of 74 slices shown, 15 images]
[im 3/74  soft-tissue]
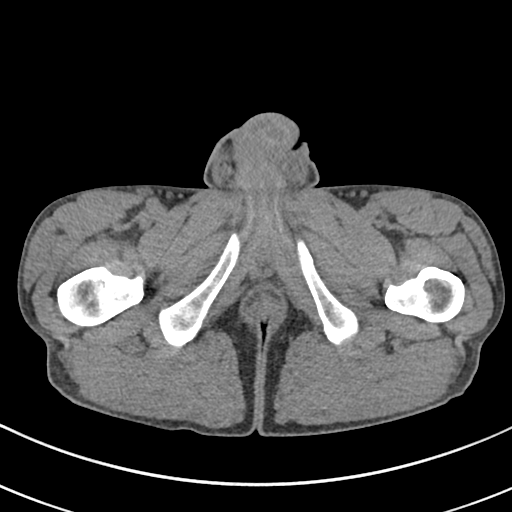
[im 3/74  bone]
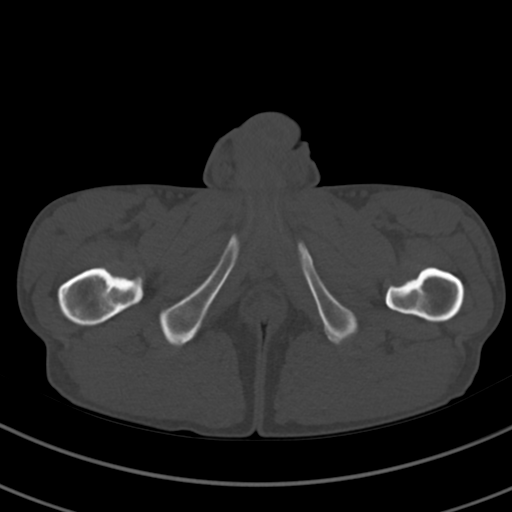
[im 9/74  soft-tissue]
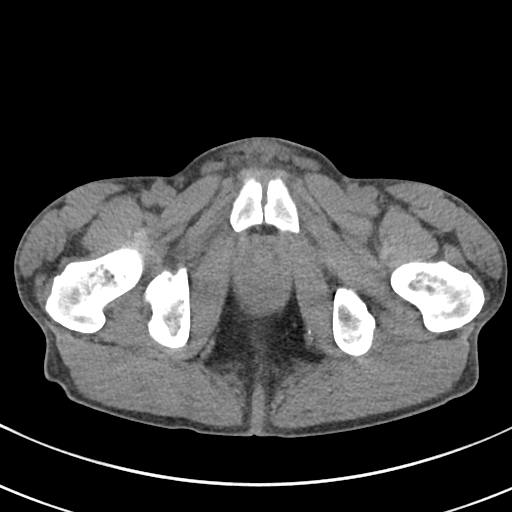
[im 15/74  soft-tissue]
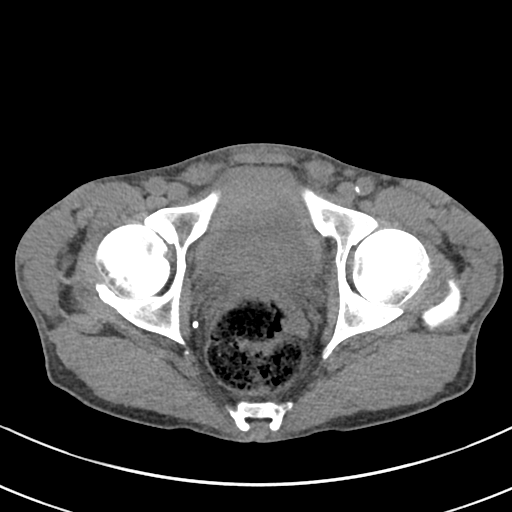
[im 21/74  soft-tissue]
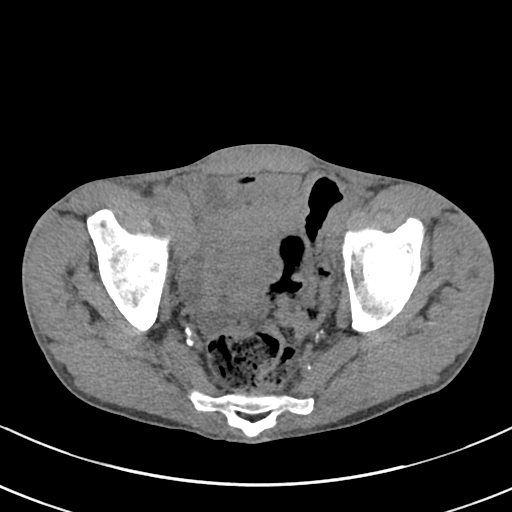
[im 27/74  soft-tissue]
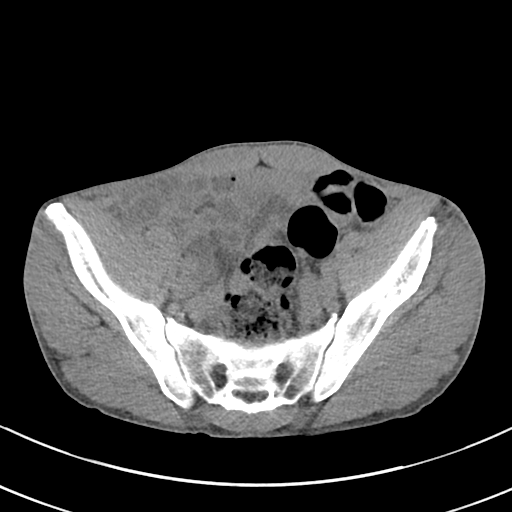
[im 33/74  soft-tissue]
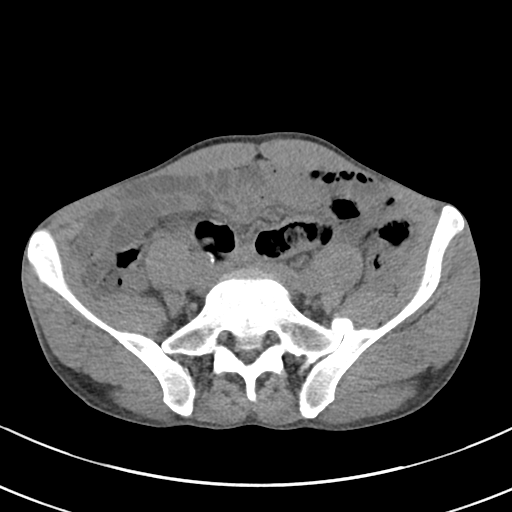
[im 38/74  soft-tissue]
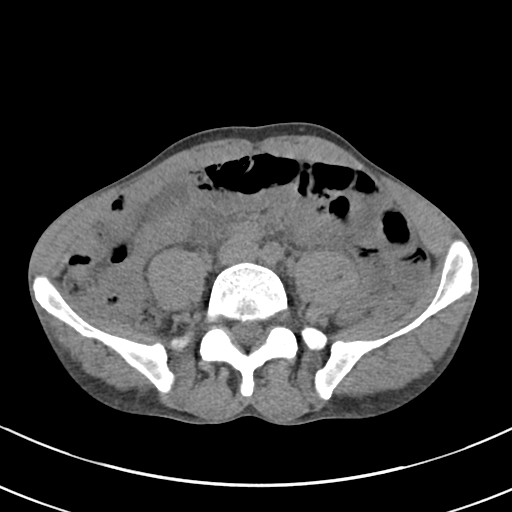
[im 41/74  soft-tissue]
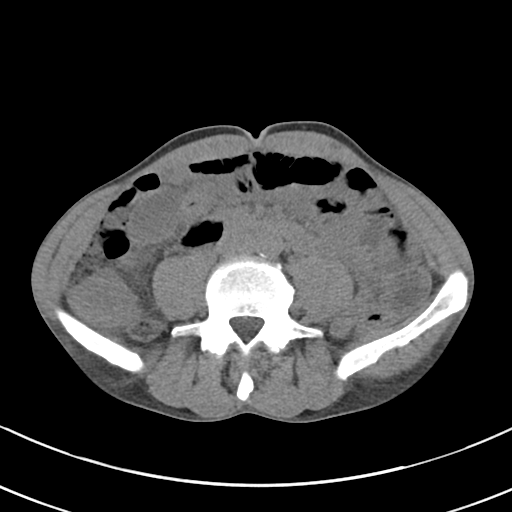
[im 47/74  soft-tissue]
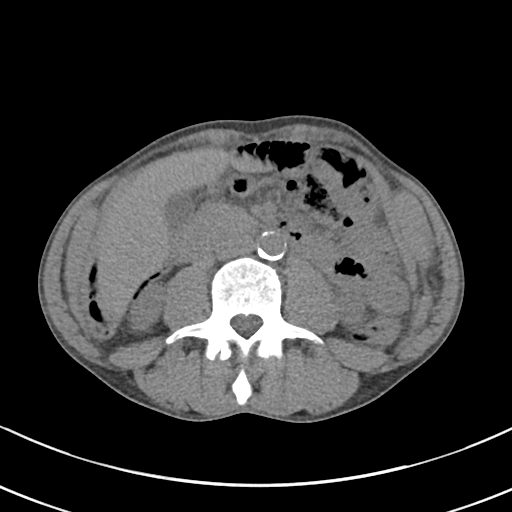
[im 47/74  bone]
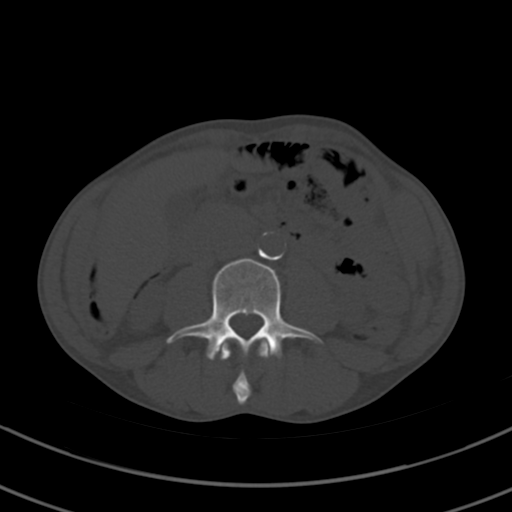
[im 53/74  soft-tissue]
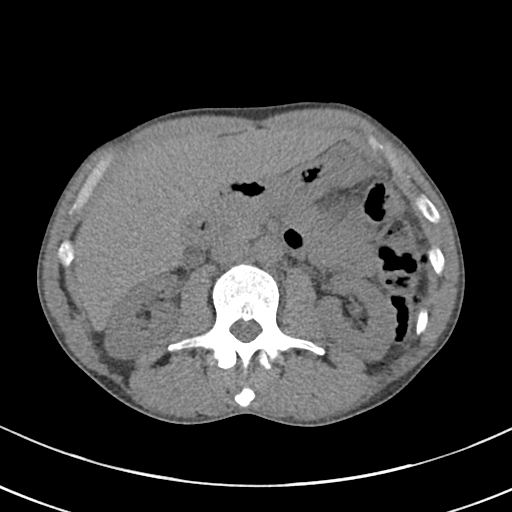
[im 59/74  soft-tissue]
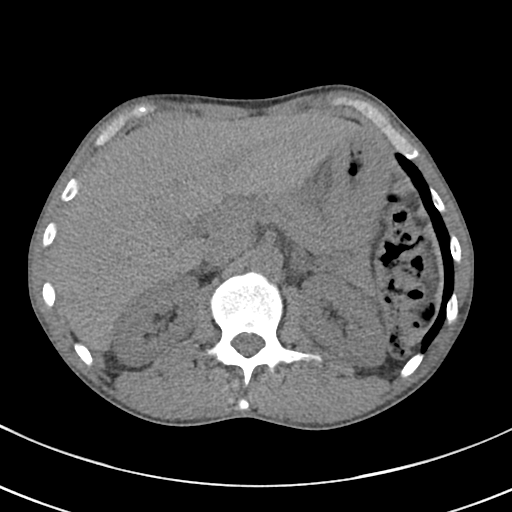
[im 65/74  soft-tissue]
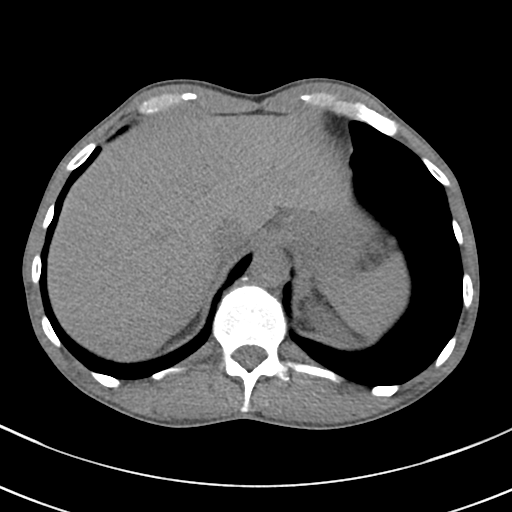
[im 71/74  soft-tissue]
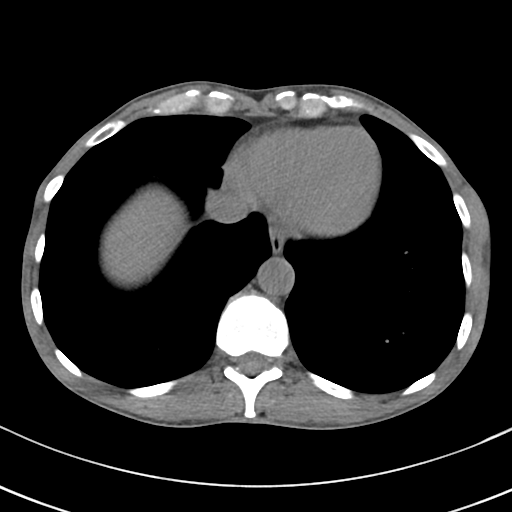

[16 of 46 positions shown; findings below may reference images not displayed]

FINDINGS: Lower chest: No acute abnormality.

Hepatobiliary: No focal liver abnormality is seen. No gallstones,
gallbladder wall thickening, or biliary dilatation.

Pancreas: Unremarkable. No pancreatic ductal dilatation or
surrounding inflammatory changes.

Spleen: Normal in size without focal abnormality.

Adrenals/Urinary Tract: Adrenal glands are unremarkable. Kidneys are
normal, without renal calculi, focal lesion, or hydronephrosis.
Bladder is unremarkable.

Stomach/Bowel: Stomach is within normal limits. Appendix is not
visualized. No evidence of bowel wall thickening, distention, or
inflammatory changes.

Vascular/Lymphatic: Aortic atherosclerosis. No enlarged abdominal or
pelvic lymph nodes.

Reproductive: Prostate is unremarkable.

Other: No abdominal wall hernia or abnormality. No abdominopelvic
ascites.

Musculoskeletal: No acute or significant osseous findings.
IMPRESSION: 1. No evidence for urinary tract calculus or hydronephrosis.
2.  Aortic Atherosclerosis (PS1CH-VK9.9).

## 2024-02-15 ENCOUNTER — Emergency Department (HOSPITAL_COMMUNITY): Payer: MEDICAID

## 2024-02-15 ENCOUNTER — Encounter (HOSPITAL_COMMUNITY): Payer: Self-pay | Admitting: *Deleted

## 2024-02-15 ENCOUNTER — Other Ambulatory Visit: Payer: Self-pay

## 2024-02-15 ENCOUNTER — Emergency Department (HOSPITAL_COMMUNITY)
Admission: EM | Admit: 2024-02-15 | Discharge: 2024-02-16 | Disposition: A | Discharge status: Home / Self Care | Payer: MEDICAID | Attending: Emergency Medicine | Admitting: Emergency Medicine

## 2024-02-15 DIAGNOSIS — R251 Tremor, unspecified: Secondary | ICD-10-CM | POA: Insufficient documentation

## 2024-02-15 DIAGNOSIS — I10 Essential (primary) hypertension: Secondary | ICD-10-CM | POA: Insufficient documentation

## 2024-02-15 LAB — RAPID URINE DRUG SCREEN, HOSP PERFORMED
Amphetamines: NOT DETECTED
Barbiturates: NOT DETECTED
Benzodiazepines: NOT DETECTED
Cocaine: POSITIVE — AB
Opiates: NOT DETECTED
Tetrahydrocannabinol: NOT DETECTED

## 2024-02-15 LAB — BASIC METABOLIC PANEL WITH GFR
Anion gap: 12 (ref 5–15)
BUN: <5 mg/dL — ABNORMAL LOW (ref 6–20)
CO2: 28 mmol/L (ref 22–32)
Calcium: 8.9 mg/dL (ref 8.9–10.3)
Chloride: 99 mmol/L (ref 98–111)
Creatinine, Ser: 0.88 mg/dL (ref 0.61–1.24)
GFR, Estimated: >60 mL/min (ref >60)
Glucose, Bld: 102 mg/dL — ABNORMAL HIGH (ref 70–99)
Potassium: 3.8 mmol/L (ref 3.5–5.1)
Sodium: 139 mmol/L (ref 135–145)

## 2024-02-15 LAB — CBC
HCT: 37.5 % — ABNORMAL LOW (ref 39.0–52.0)
Hemoglobin: 12.7 g/dL — ABNORMAL LOW (ref 13.0–17.0)
MCH: 32.2 pg (ref 26.0–34.0)
MCHC: 33.9 g/dL (ref 30.0–36.0)
MCV: 94.9 fL (ref 80.0–100.0)
Platelets: 104 K/uL — ABNORMAL LOW (ref 150–400)
RBC: 3.95 MIL/uL — ABNORMAL LOW (ref 4.22–5.81)
RDW: 14.3 % (ref 11.5–15.5)
WBC: 3.9 K/uL — ABNORMAL LOW (ref 4.0–10.5)
nRBC: 0 % (ref 0.0–0.2)

## 2024-02-15 LAB — TROPONIN I (HIGH SENSITIVITY)
Troponin I (High Sensitivity): 9 ng/L (ref <18)
Troponin I (High Sensitivity): 8 ng/L (ref <18)

## 2024-02-15 LAB — ETHANOL: Alcohol, Ethyl (B): 46 mg/dL — ABNORMAL HIGH (ref <15)

## 2024-02-15 NOTE — ED Provider Notes (Signed)
 [place] EMERGENCY DEPARTMENT AT [facility] Provider Note   CSN: [id number] Arrival date & time: 02/15/24  1514     Patient presents with: Chest Pain and Hypertension   Steven Arellano is a 58 y.o. male.  {Add pertinent medical, surgical, social history, OB history to YEP:67052} The history is provided by the patient and medical records.  Chest Pain Hypertension Associated symptoms include chest pain.   58 year old male with no prior PMH (but also has not seen a doctor in many years) presenting to the ED for various complaints.  Major complaint is that he has been having tremors over the past 5-6 days.  States he noticed this in his arms/legs.  He also has started to having trouble walking over the past few days.  Significant other at the bedside reports he seems like he is leaning to his left when trying to walk and he has a shuffling gait which he has never had before.  Patient is a alcoholic, he drinks about a pint of liquor daily.  This has been no different over the past few days, he has not cut back.  Does not notice any improvement of the tremors when drinking.  He does admit to some occasional cocaine use as well.  Denies any headache, numbness, weakness, blurred vision, etc.  Not on anticoagulation. No prior hx of TIA or CVA.  Patient also reports some mild chest pain.  States left-sided, dull and aching without radiation.  No shortness of breath, diaphoresis, nausea, or vomiting.  He has not had any cough, fever, or chills.  Has had some fatigue which is unusual for him.  Of note, patient is hypertensive on arrival.  He states he has no idea what his normal blood pressure is, does not take any daily medications.  Prior to Admission medications   Medication Sig Start Date End Date Taking? Authorizing Provider  diclofenac  (VOLTAREN ) 75 MG EC tablet Take 1 tablet (75 mg total) by mouth 2 (two) times daily. 03/11/21   Sofia, Leslie K, PA-C  methocarbamol  (ROBAXIN ) 500 MG  tablet Take 1 tablet (500 mg total) by mouth 4 (four) times daily. 03/11/21   Flint Sonny POUR, PA-C  predniSONE  (DELTASONE ) 20 MG tablet Take 2 tablets (40 mg total) by mouth daily. 04/12/21   Patsey Lot, MD    Allergies: Patient has no known allergies.    Review of Systems  Cardiovascular:  Positive for chest pain.  All other systems reviewed and are negative.   Updated Vital Signs BP (!) 201/139   Pulse 68   Temp 98.2 F (36.8 C) (Oral)   Resp 11   Ht 5' 11 (1.803 m)   Wt 59.9 kg   SpO2 100%   BMI 18.42 kg/m   Physical Exam Vitals and nursing note reviewed.  Constitutional:      Appearance: He is well-developed.  HENT:     Head: Normocephalic and atraumatic.  Eyes:     Conjunctiva/sclera: Conjunctivae normal.     Pupils: Pupils are equal, round, and reactive to light.     Comments: EOMs intact, PERRL, no field cuts present  Cardiovascular:     Rate and Rhythm: Normal rate and regular rhythm.     Heart sounds: Normal heart sounds.  Pulmonary:     Effort: Pulmonary effort is normal.     Breath sounds: Normal breath sounds. No wheezing or rhonchi.  Abdominal:     General: Bowel sounds are normal.     Palpations:  Abdomen is soft.  Musculoskeletal:        General: Normal range of motion.     Cervical back: Normal range of motion.  Skin:    General: Skin is warm and dry.  Neurological:     Mental Status: He is alert and oriented to person, place, and time.     Comments: AAOx3, answering questions and following commands appropriately; equal strength UE and LE bilaterally; CN grossly intact; does seem to have very minor tremor of the BUE (hands), less so BLE, moves all extremities appropriately without ataxia; no focal neuro deficits or facial asymmetry appreciated     (all labs ordered are listed, but only abnormal results are displayed) Labs Reviewed  BASIC METABOLIC PANEL WITH GFR - Abnormal; Notable for the following components:      Result Value    Glucose, Bld 102 (*)    BUN <5 (*)    All other components within normal limits  CBC - Abnormal; Notable for the following components:   WBC 3.9 (*)    RBC 3.95 (*)    Hemoglobin 12.7 (*)    HCT 37.5 (*)    Platelets 104 (*)    All other components within normal limits  ETHANOL - Abnormal; Notable for the following components:   Alcohol, Ethyl (B) 46 (*)    All other components within normal limits  RAPID URINE DRUG SCREEN, HOSP PERFORMED - Abnormal; Notable for the following components:   Cocaine POSITIVE (*)    All other components within normal limits  HEPATIC FUNCTION PANEL  MAGNESIUM  TSH  T4, FREE  TROPONIN I (HIGH SENSITIVITY)  TROPONIN I (HIGH SENSITIVITY)    EKG: None  Radiology: CT Head Wo Contrast Result Date: 02/15/2024 CLINICAL DATA:  Ataxia, head trauma EXAM: CT HEAD WITHOUT CONTRAST TECHNIQUE: Contiguous axial images were obtained from the base of the skull through the vertex without intravenous contrast. RADIATION DOSE REDUCTION: This exam was performed according to the departmental dose-optimization program which includes automated exposure control, adjustment of the mA and/or kV according to patient size and/or use of iterative reconstruction technique. COMPARISON:  CT head Nov 08, 2003. FINDINGS: Brain: No evidence of acute infarction, hemorrhage, hydrocephalus, extra-axial collection or mass lesion/mass effect. Vascular: No hyperdense vessel. Skull: No acute fracture. Sinuses/Orbits: Clear sinuses.  No acute orbital findings. Other: No mastoid effusions. IMPRESSION: No evidence of acute intracranial abnormality. Electronically Signed   By: Gilmore GORMAN Molt M.D.   On: 02/15/2024 22:49   DG Chest 2 View Result Date: 02/15/2024 CLINICAL DATA:  lt shoulder pain for one week fatigue and tremors in both arms and legs EXAM: CHEST - 2 VIEW COMPARISON:  None available. FINDINGS: No focal airspace consolidation, pleural effusion, or pneumothorax. No cardiomegaly.No acute  fracture or destructive lesion. Multilevel thoracic osteophytosis. IMPRESSION: No acute cardiopulmonary abnormality. Electronically Signed   By: Rogelia Myers M.D.   On: 02/15/2024 16:06    {Document cardiac monitor, telemetry assessment procedure when appropriate:32947} Procedures   Medications Ordered in the ED - No data to display    {Click here for ABCD2, HEART and other calculators REFRESH Note before signing:1}                              Medical Decision Making Amount and/or Complexity of Data Reviewed Labs: ordered. Radiology: ordered and independent interpretation performed. ECG/medicine tests: ordered and independent interpretation performed.   58 year old male presenting to the ED with  tremors of the past 5 to 6 days.  He is an alcoholic but has been drinking normal amounts, has not attempted to cut back.  Tremors did not improve with alcohol use.  Also has started to be off balance with shuffling gait.  He is awake, alert, oriented here.  He does not have any gross neurologic deficits.  Does seem to have some faint tremors, more so in the hands than the lower extremities.  He denies any numbness or weakness.  Also had some chest pain recently, has been intermittent but none currently.   Work-up initiated in triage--labs without leukocytosis or significant electrolyte derangement.    Ethanol 46.  His UDS is positive for cocaine.  Chest x-ray is clear.  Troponin negative x 2.  In light of his neurologic symptoms, discussion that this could be related to alcohol use, however has been drinking his usual amount and no change with tremors when drinking.  He also tested positive for cocaine today so increased risk of stroke.  Will add on CT head as well.  Also mentioned some fatigue so will add on TSH, T4, magnesium, and hepatic panel given his EtOH use.  11:24 PM CT head negative.  Patient BP continues to rise, now 200's systolic.  He will not allow RN to start IV and is resistant  to medication for BP control.  He states he does not want any further blood draws.  Some of his tremors may be attributed to alcohol use, however his balance issues and shuffling gait are not explained otherwise.  He is agreeable to neurology consult to see what they recommend.  Final diagnoses:  None    ED Discharge Orders     None

## 2024-02-15 NOTE — ED Notes (Signed)
 Pt decided to step outside.

## 2024-02-15 NOTE — ED Triage Notes (Signed)
 The pt is c/o chest pain tremor of both legs and arms  that started 5-6 days ago  no previous history.  Hypertension  he denies any  pms  eh reports that he just woke up one morning and had the shakes

## 2024-02-15 NOTE — ED Triage Notes (Signed)
 The pt reports pain in his lt shoulder also  the pt drinks alcohol 1 pint a day  his last drink was this am   and his last cocaine  was last pm

## 2024-02-16 DIAGNOSIS — G1119 Other early-onset cerebellar ataxia: Secondary | ICD-10-CM

## 2024-02-16 DIAGNOSIS — G252 Other specified forms of tremor: Secondary | ICD-10-CM

## 2024-02-16 MED ORDER — AMLODIPINE BESYLATE 5 MG PO TABS: 5.0000 mg | ORAL_TABLET | Freq: Every day | ORAL | 0 refills | Status: AC

## 2024-02-16 MED ORDER — THIAMINE HCL 100 MG PO TABS: 100.0000 mg | ORAL_TABLET | Freq: Every day | ORAL | 0 refills | Status: AC

## 2024-02-16 NOTE — Consult Note (Signed)
 NEUROLOGY CONSULT NOTE   Date of service: February 16, 2024 Patient Name: Steven Arellano MRN:  [id number] DOB:  Sep 28, 1965 Chief Complaint: Uncontrolled irregular shaking Requesting Provider: No att. providers found  History of Present Illness  Steven Arellano is a 58 y.o. male with hx of chronic alcohol use disorder, cocaine use who presents with tremors.   Reports he has always had the shakes since he was a child, however they would last only an hour or two. However, since waking up 5 days ago, reports they have been constant. All of his body will shake, the severity of which will fluctuate throughout the day, typically the greatest in the morning and less at night. Wife reports it disappears when he is sleeping. It will interfere with his walking to the point that he cannot walk straight. Denies reliably walking to one side or the other. Denies falling.  Has frequent cramping of his legs especially if he sits for too long.  Also has noticed difficulty eating. Reports choking on his food more.  No changes in his vision, voice, respiratory, urinary/bowel habits.   No unexplained weight loss.   Although he drinks daily, denies any changes in his drinking habits recently prior.  Smokes 1.5 ppd x more than 20 years.  Has a brother who has a head tremor. Has two other siblings without movement problems. Has a mother and maternal aunt with breast cancer. Denies any family member with difficulty walking or wheelchair-bound.      ROS  Comprehensive ROS performed and pertinent positives documented in HPI    Past History   Past Medical History:  Diagnosis Date   Heart murmur     History reviewed. No pertinent surgical history.  Family History: History reviewed. No pertinent family history.  Social History  reports that he has been smoking cigarettes. He has never used smokeless tobacco. He reports current alcohol use. He reports current drug use. Drug: Cocaine.  No Known  Allergies  Medications  No current facility-administered medications for this encounter.  Current Outpatient Medications:    amLODipine  (NORVASC ) 5 MG tablet, Take 1 tablet (5 mg total) by mouth daily., Disp: 30 tablet, Rfl: 0   thiamine  (VITAMIN B1) 100 MG tablet, Take 1 tablet (100 mg total) by mouth daily., Disp: 30 tablet, Rfl: 0   diclofenac  (VOLTAREN ) 75 MG EC tablet, Take 1 tablet (75 mg total) by mouth 2 (two) times daily., Disp: 20 tablet, Rfl: 0   methocarbamol  (ROBAXIN ) 500 MG tablet, Take 1 tablet (500 mg total) by mouth 4 (four) times daily., Disp: 20 tablet, Rfl: 0   predniSONE  (DELTASONE ) 20 MG tablet, Take 2 tablets (40 mg total) by mouth daily., Disp: 6 tablet, Rfl: 0  Vitals   Vitals:   02/15/24 2130 02/15/24 2131 02/15/24 2215 02/16/24 0030  BP: (!) 201/139  (!) 191/115 (!) 176/97  Pulse: 68  63 72  Resp: 11  14 13   Temp:  98.2 F (36.8 C)    TempSrc:  Oral    SpO2: 100%  100% 100%  Weight:      Height:        Body mass index is 18.42 kg/m.   Physical Exam   Constitutional: Thin. Psych: Affect appropriate to situation.  Eyes: No scleral injection.  HENT: No OP obstruction.  Head: Normocephalic.  Cardiovascular: Normal rate and regular rhythm.  Respiratory: Effort normal, non-labored breathing.  GI: Soft.  No distension. There is no tenderness.  Skin: WDI.   Neurologic  Examination   Mental status: Alert and oriented to person, place and time. Able to provide history. Speech: no word finding difficulty, paraphasic errors, dysarthria Cranial nerves: PERRLA EOMI Face symmetric at rest and with activation V1-V3 intact to light touch Hearing grossly intact Palate elevation symmetric. Vocal tremor present. Tongue protrudes midline and full ROM. Is tremulous at rest and with protrusion. SCM's full strength bilaterally. Motor: Decreased bulk. No thenar atrophy. Normal tone. No head tremor (tested supine). BUE tremor, present distally (however  reports is proximal earlier in the day) Fasciculations observed in bilateral arms.  RUE: shoulder abduction 5/5, biceps 5/5, triceps 5/5, wrist flexion 5/5, wrist extension 5/5, LUE: shoulder abduction 5/5, biceps 5/5, triceps 5/5, wrist flexion 5/5, wrist extension 5/5, finger extension 5/5, hand grip 5/5 RLE: hip flexion 5/5, knee extension 5/5, knee flexion 5/5, plantar flexion 5/5, dorsiflexion 5/5 RLE: hip flexion 5/5, knee extension 5/5, knee flexion 5/5, plantar flexion 5/5, dorsiflexion 5/5 Reflexes: 3+ throughout Suprapatellars and cross adductors present bilaterally Negative Hoffman Toes are mute. Sensation: Intact to light touch throughout. Coordination: FTN intact however only with very slow movement. HTS not intact bilaterally. Gait: Can stand up on his own. Upon standing, LE's are tremulous immediately. Appears to disappear with walking.  Labs/Imaging/Neurodiagnostic studies   CBC:  Recent Labs  Lab 01-Mar-2024 1531  WBC 3.9*  HGB 12.7*  HCT 37.5*  MCV 94.9  PLT 104*   Basic Metabolic Panel:  Lab Results  Component Value Date   NA 139 03/01/2024   K 3.8 03/01/24   CO2 28 2024/03/01   GLUCOSE 102 (H) 2024-03-01   BUN <5 (L) 2024/03/01   CREATININE 0.88 03/01/24   CALCIUM 8.9 03-01-2024   GFRNONAA >60 Mar 01, 2024   GFRAA >60 04/14/2018   Lipid Panel: No results found for: LDLCALC HgbA1c: No results found for: HGBA1C Urine Drug Screen:     Component Value Date/Time   LABOPIA NONE DETECTED 01-Mar-2024 1531   COCAINSCRNUR POSITIVE (A) 03/01/24 1531   LABBENZ NONE DETECTED 2024/03/01 1531   AMPHETMU NONE DETECTED 03/01/24 1531   THCU NONE DETECTED 03/01/24 1531   LABBARB NONE DETECTED 2024-03-01 1531    Alcohol Level     Component Value Date/Time   ETH 46 (H) 03/01/2024 1531   INR No results found for: INR APTT No results found for: APTT AED levels: No results found for: PHENYTOIN, ZONISAMIDE, LAMOTRIGINE, LEVETIRACETA  CT  Head without contrast(Personally reviewed): No acute abnormality. Greater than expected atrophy for his age.  ASSESSMENT   Steven Arellano is a 58 y.o. male with history of polysubstance use who presents with tremors of the tongue, vocal cords, arms, and legs. Tremor of legs consistent with orthostatic tremor as it disappears with walking. In addition, has lower extremity ataxia. Exam also with diffuse hyperreflexia as well as numerous fasciculations in his arms. Although his symptoms could be secondary to his polysubstance use and likely poor diet (BMI 18.42), history of head tremor in his younger brother suggests possible genetic etiology, such as spinocerebellar ataxia. Fasciculations could be due to cramp-fasciculation syndrome as he does suffer from lower extremity cramps often, however EMG/NCS will be needed for clarification.  Unfortunately patient refused further testing at this time.   RECOMMENDATIONS  Thiamine  supplementation Referral to outpatient neurology for consideration of: EMG/NCS MRI brain w/wo contrast with sedation While sedated, serum studies for vitamin deficiencies ______________________________________________________________________    Signed, Steven Niu M Egan Sahlin, MD [facility]

## 2024-02-16 NOTE — Discharge Instructions (Signed)
 You were seen today for tremors and some gait difficulties.  Our neurologist has recommended further workup here including further labs and MRI but you have declined this.  I have placed a referral to neurology you can have this done as an outpatient.  Is very important that you follow-up as soon as possible. Take the prescribed medication as directed. Return to the ED for new or worsening symptoms.
